# Patient Record
Sex: Male | Born: 1965 | Race: Black or African American | Hispanic: No | Marital: Married | Smoking: Never smoker
Health system: Southern US, Community
[De-identification: ages and names within clinical notes are randomized; demographics above are authoritative.]

---

## 2017-11-07 ENCOUNTER — Encounter: Payer: Self-pay | Admitting: Emergency Medicine

## 2017-11-07 ENCOUNTER — Other Ambulatory Visit: Payer: Self-pay

## 2017-11-07 ENCOUNTER — Emergency Department: Payer: BLUE CROSS/BLUE SHIELD

## 2017-11-07 ENCOUNTER — Emergency Department
Admission: EM | Admit: 2017-11-07 | Discharge: 2017-11-07 | Disposition: A | Discharge status: Home / Self Care | Payer: BLUE CROSS/BLUE SHIELD | Attending: Student in an Organized Health Care Education/Training Program | Admitting: Student in an Organized Health Care Education/Training Program

## 2017-11-07 DIAGNOSIS — M25511 Pain in right shoulder: Secondary | ICD-10-CM | POA: Diagnosis present

## 2017-11-07 DIAGNOSIS — M542 Cervicalgia: Secondary | ICD-10-CM | POA: Insufficient documentation

## 2017-11-07 MED ORDER — KETOROLAC TROMETHAMINE 30 MG/ML IJ SOLN
30.0000 mg | Freq: Once | INTRAMUSCULAR | Status: AC
  Administered 2017-11-07: 30 mg via INTRAMUSCULAR
  Filled 2017-11-07: qty 1

## 2017-11-07 MED ORDER — CYCLOBENZAPRINE HCL 10 MG PO TABS: 10.0000 mg | ORAL_TABLET | Freq: Three times a day (TID) | ORAL | 0 refills | Status: AC | PRN

## 2017-11-07 MED ORDER — ORPHENADRINE CITRATE 30 MG/ML IJ SOLN
60.0000 mg | Freq: Two times a day (BID) | INTRAMUSCULAR | Status: DC
  Administered 2017-11-07: 60 mg via INTRAMUSCULAR
  Filled 2017-11-07: qty 2

## 2017-11-07 MED ORDER — MELOXICAM 15 MG PO TABS: 15.0000 mg | ORAL_TABLET | Freq: Every day | ORAL | 1 refills | Status: AC
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
## 2018-01-21 ENCOUNTER — Other Ambulatory Visit: Payer: Self-pay | Admitting: Sports Medicine

## 2018-01-21 DIAGNOSIS — M62838 Other muscle spasm: Secondary | ICD-10-CM

## 2018-01-21 DIAGNOSIS — M542 Cervicalgia: Secondary | ICD-10-CM

## 2018-01-21 DIAGNOSIS — M629 Disorder of muscle, unspecified: Secondary | ICD-10-CM

## 2018-01-21 DIAGNOSIS — M5382 Other specified dorsopathies, cervical region: Secondary | ICD-10-CM

## 2018-01-21 DIAGNOSIS — M79601 Pain in right arm: Secondary | ICD-10-CM

## 2018-01-30 ENCOUNTER — Ambulatory Visit
Admission: RE | Admit: 2018-01-30 | Discharge: 2018-01-30 | Disposition: A | Discharge status: Home / Self Care | Payer: BLUE CROSS/BLUE SHIELD | Source: Ambulatory Visit | Attending: Sports Medicine | Admitting: Sports Medicine

## 2018-01-30 DIAGNOSIS — M79601 Pain in right arm: Secondary | ICD-10-CM | POA: Diagnosis present

## 2018-01-30 DIAGNOSIS — M47812 Spondylosis without myelopathy or radiculopathy, cervical region: Secondary | ICD-10-CM | POA: Insufficient documentation

## 2018-01-30 DIAGNOSIS — M5382 Other specified dorsopathies, cervical region: Secondary | ICD-10-CM | POA: Diagnosis present

## 2018-01-30 DIAGNOSIS — M62838 Other muscle spasm: Secondary | ICD-10-CM

## 2018-01-30 DIAGNOSIS — M50323 Other cervical disc degeneration at C6-C7 level: Secondary | ICD-10-CM | POA: Insufficient documentation

## 2018-01-30 DIAGNOSIS — M542 Cervicalgia: Secondary | ICD-10-CM

## 2018-01-30 DIAGNOSIS — M629 Disorder of muscle, unspecified: Secondary | ICD-10-CM

## 2018-01-30 DIAGNOSIS — M4802 Spinal stenosis, cervical region: Secondary | ICD-10-CM | POA: Diagnosis not present
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
